# Patient Record
Sex: Male | Born: 2000 | Race: Black or African American | Hispanic: No | Marital: Single
Health system: Southern US, Community
[De-identification: ages and names within clinical notes are randomized; demographics above are authoritative.]

---
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
## 2020-03-23 ENCOUNTER — Encounter (HOSPITAL_COMMUNITY): Payer: Self-pay

## 2020-03-23 ENCOUNTER — Emergency Department (HOSPITAL_COMMUNITY)
Admission: EM | Admit: 2020-03-23 | Discharge: 2020-03-23 | Disposition: A | Discharge status: Home / Self Care | Payer: Self-pay | Attending: Emergency Medicine | Admitting: Emergency Medicine

## 2020-03-23 ENCOUNTER — Emergency Department (HOSPITAL_COMMUNITY): Payer: Self-pay

## 2020-03-23 DIAGNOSIS — R110 Nausea: Secondary | ICD-10-CM | POA: Insufficient documentation

## 2020-03-23 DIAGNOSIS — W3400XD Accidental discharge from unspecified firearms or gun, subsequent encounter: Secondary | ICD-10-CM | POA: Insufficient documentation

## 2020-03-23 DIAGNOSIS — S71101D Unspecified open wound, right thigh, subsequent encounter: Secondary | ICD-10-CM | POA: Insufficient documentation

## 2020-03-23 DIAGNOSIS — S81831A Puncture wound without foreign body, right lower leg, initial encounter: Secondary | ICD-10-CM

## 2020-03-23 DIAGNOSIS — R079 Chest pain, unspecified: Secondary | ICD-10-CM | POA: Insufficient documentation

## 2020-03-23 DIAGNOSIS — Z4800 Encounter for change or removal of nonsurgical wound dressing: Secondary | ICD-10-CM | POA: Insufficient documentation

## 2020-03-23 DIAGNOSIS — R519 Headache, unspecified: Secondary | ICD-10-CM | POA: Insufficient documentation

## 2020-03-23 DIAGNOSIS — S71132A Puncture wound without foreign body, left thigh, initial encounter: Secondary | ICD-10-CM

## 2020-03-23 DIAGNOSIS — R109 Unspecified abdominal pain: Secondary | ICD-10-CM | POA: Insufficient documentation

## 2020-03-23 DIAGNOSIS — S71102D Unspecified open wound, left thigh, subsequent encounter: Secondary | ICD-10-CM | POA: Insufficient documentation

## 2020-03-23 DIAGNOSIS — M549 Dorsalgia, unspecified: Secondary | ICD-10-CM | POA: Insufficient documentation
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
## 2020-03-24 ENCOUNTER — Other Ambulatory Visit: Payer: Self-pay

## 2020-03-24 ENCOUNTER — Ambulatory Visit
Admission: EM | Admit: 2020-03-24 | Discharge: 2020-03-24 | Disposition: A | Discharge status: Home / Self Care | Payer: Medicaid Other

## 2020-03-24 ENCOUNTER — Emergency Department (HOSPITAL_COMMUNITY)
Admission: EM | Admit: 2020-03-24 | Discharge: 2020-03-24 | Disposition: A | Discharge status: Home / Self Care | Payer: Medicaid Other | Attending: Emergency Medicine | Admitting: Emergency Medicine

## 2020-03-24 DIAGNOSIS — Z5189 Encounter for other specified aftercare: Secondary | ICD-10-CM

## 2020-03-24 DIAGNOSIS — R200 Anesthesia of skin: Secondary | ICD-10-CM | POA: Insufficient documentation

## 2020-03-24 DIAGNOSIS — Z4800 Encounter for change or removal of nonsurgical wound dressing: Secondary | ICD-10-CM | POA: Insufficient documentation

## 2020-03-24 DIAGNOSIS — M79652 Pain in left thigh: Secondary | ICD-10-CM | POA: Insufficient documentation

## 2020-03-24 MED ORDER — CEPHALEXIN 500 MG PO CAPS: 500.0000 mg | ORAL_CAPSULE | Freq: Four times a day (QID) | ORAL | 0 refills | Status: AC
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
## 2020-03-24 NOTE — ED Notes (Signed)
Pt requested and given a work note 

## 2020-03-24 NOTE — Discharge Instructions (Signed)
You can take Tylenol or Ibuprofen as directed for pain. You can alternate Tylenol and Ibuprofen every 4 hours. If you take Tylenol at 1pm, then you can take Ibuprofen at 5pm. Then you can take Tylenol again at 9pm.   Keep the areas clean and dry.  Make sure you are applying dressings.  As we discussed, closely monitor for any signs of infection.  This includes warmth, redness, drainage from the wounds, fever.  As we discussed, I provided you antibiotics that you can start if you feel that this starts getting worse.  Otherwise hold off on antibiotics.  Make sure you follow-up with the referred trauma surgeon.  Return emergency department for any fever, difficulty walking, worsening pain or any other worsening concerning symptoms.
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
## 2022-03-21 ENCOUNTER — Other Ambulatory Visit: Payer: Self-pay

## 2022-05-19 NOTE — Progress Notes (Signed)
Erroneous encounter-disregard

## 2022-05-23 ENCOUNTER — Encounter: Payer: Medicaid Other | Admitting: Family

## 2022-05-23 DIAGNOSIS — Z7689 Persons encountering health services in other specified circumstances: Secondary | ICD-10-CM
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
